# Patient Record
Sex: Male | Born: 1995 | Race: Black or African American | Hispanic: No | Marital: Single | Smoking: Never smoker
Health system: Southern US, Community
[De-identification: ages and names within clinical notes are randomized; demographics above are authoritative.]

---

## 2017-05-03 ENCOUNTER — Emergency Department (HOSPITAL_COMMUNITY)
Admission: EM | Admit: 2017-05-03 | Discharge: 2017-05-03 | Disposition: A | Discharge status: Home / Self Care | Payer: Self-pay | Attending: Emergency Medicine | Admitting: Emergency Medicine

## 2017-05-03 ENCOUNTER — Emergency Department (HOSPITAL_COMMUNITY): Payer: Self-pay

## 2017-05-03 ENCOUNTER — Encounter (HOSPITAL_COMMUNITY): Payer: Self-pay | Admitting: *Deleted

## 2017-05-03 DIAGNOSIS — Y92410 Unspecified street and highway as the place of occurrence of the external cause: Secondary | ICD-10-CM | POA: Insufficient documentation

## 2017-05-03 DIAGNOSIS — S299XXA Unspecified injury of thorax, initial encounter: Secondary | ICD-10-CM | POA: Insufficient documentation

## 2017-05-03 DIAGNOSIS — Y9389 Activity, other specified: Secondary | ICD-10-CM | POA: Insufficient documentation

## 2017-05-03 DIAGNOSIS — Y999 Unspecified external cause status: Secondary | ICD-10-CM | POA: Insufficient documentation

## 2017-05-03 MED ORDER — ACETAMINOPHEN 500 MG PO TABS: 500.0000 mg | ORAL_TABLET | Freq: Four times a day (QID) | ORAL | 0 refills | Status: AC | PRN

## 2017-05-03 MED ORDER — IBUPROFEN 800 MG PO TABS: 800.0000 mg | ORAL_TABLET | Freq: Three times a day (TID) | ORAL | 0 refills | Status: AC

## 2017-05-03 MED ORDER — METHOCARBAMOL 500 MG PO TABS: 500.0000 mg | ORAL_TABLET | Freq: Two times a day (BID) | ORAL | 0 refills | Status: AC
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
## 2017-05-03 NOTE — ED Notes (Signed)
Pt is in stable condition upon d/c and ambulates from ED. 
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
## 2021-05-31 ENCOUNTER — Encounter (HOSPITAL_COMMUNITY): Payer: Self-pay | Admitting: Emergency Medicine

## 2021-05-31 ENCOUNTER — Emergency Department (HOSPITAL_COMMUNITY)
Admission: EM | Admit: 2021-05-31 | Discharge: 2021-06-01 | Disposition: A | Discharge status: Home / Self Care | Payer: Self-pay | Attending: Emergency Medicine | Admitting: Emergency Medicine

## 2021-05-31 DIAGNOSIS — R42 Dizziness and giddiness: Secondary | ICD-10-CM | POA: Insufficient documentation

## 2021-05-31 DIAGNOSIS — R55 Syncope and collapse: Secondary | ICD-10-CM | POA: Insufficient documentation
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
## 2021-06-01 NOTE — Discharge Instructions (Addendum)
EKG, vital signs, and exam were reassuring. Highly recommend follow up with a primary care provider for further evaluation.  Return to the ED for chest pain, shortness of breath, passing out, persistent vomiting, or any other major concerns.

## 2023-03-21 ENCOUNTER — Ambulatory Visit
Admission: EM | Admit: 2023-03-21 | Discharge: 2023-03-21 | Disposition: A | Discharge status: Home / Self Care | Payer: Managed Care, Other (non HMO)

## 2023-03-21 DIAGNOSIS — J101 Influenza due to other identified influenza virus with other respiratory manifestations: Secondary | ICD-10-CM | POA: Diagnosis not present

## 2023-03-21 LAB — POCT INFLUENZA A/B
Influenza A, POC: POSITIVE — AB
Influenza B, POC: NEGATIVE

## 2023-03-21 MED ORDER — OSELTAMIVIR PHOSPHATE 75 MG PO CAPS: 75.0000 mg | ORAL_CAPSULE | Freq: Two times a day (BID) | ORAL | 0 refills | Status: AC

## 2023-03-21 NOTE — Discharge Instructions (Signed)
You have the flu which is being treated with Tamiflu.  Follow-up if any symptoms persist or worsen.

## 2023-03-21 NOTE — ED Triage Notes (Signed)
Here for "cold symptoms that started yesterday with a little fever". Fever "around 100 yesterday". Other symptoms: "runny nose, headache, chills". No sob.

## 2023-03-21 NOTE — ED Provider Notes (Signed)
[facility]    CSN: [id number] Arrival date & time: 03/21/23  0857      History   Chief Complaint Chief Complaint  Patient presents with   Fever    Entered by patient    HPI Chris Jones is a 27 y.o. male.   Patient presents with nasal congestion, runny nose, cough, fever, headache, chills that started yesterday.  Patient reports Tmax at home of 100.  Denies any known sick contacts.  Denies chest pain, shortness of breath, gastrointestinal symptoms.  Denies history of asthma.   Fever   History reviewed. No pertinent past medical history.  There are no problems to display for this patient.   History reviewed. No pertinent surgical history.     Home Medications    Prior to Admission medications   Medication Sig Start Date End Date Taking? Authorizing Provider  oseltamivir (TAMIFLU) 75 MG capsule Take 1 capsule (75 mg total) by mouth every 12 (twelve) hours. 03/21/23  Yes Lashawnta Burgert, Acie Fredrickson, FNP  UNABLE TO FIND Med Name: Cold medicine "last night". Store brand   Yes [provider]  acetaminophen (TYLENOL) 500 MG tablet Take 1 tablet (500 mg total) by mouth every 6 (six) hours as needed. 05/03/17   Law, Waylan Boga, PA-C  ibuprofen (ADVIL,MOTRIN) 800 MG tablet Take 1 tablet (800 mg total) by mouth 3 (three) times daily. 05/03/17   Law, Waylan Boga, PA-C  methocarbamol (ROBAXIN) 500 MG tablet Take 1 tablet (500 mg total) by mouth 2 (two) times daily. 05/03/17   Emi Holes, PA-C    Family History History reviewed. No pertinent family history.  Social History Social History   Tobacco Use   Smoking status: Never   Smokeless tobacco: Never  Vaping Use   Vaping Use: Never used  Substance Use Topics   Alcohol use: No   Drug use: No     Allergies   Patient has no known allergies.   Review of Systems Review of Systems Per HPI  Physical Exam Triage Vital Signs ED Triage Vitals  Enc Vitals Group     BP 03/21/23 0927 119/72     Pulse  Rate 03/21/23 0927 88     Resp 03/21/23 0927 18     Temp 03/21/23 0927 98.6 F (37 C)     Temp Source 03/21/23 0927 Oral     SpO2 03/21/23 0927 98 %     Weight 03/21/23 0924 165 lb (74.8 kg)     Height 03/21/23 0924 5\' 8"  (1.727 m)     Head Circumference --      Peak Flow --      Pain Score 03/21/23 0924 0     Pain Loc --      Pain Edu? --      Excl. in GC? --    No data found.  Updated Vital Signs BP 119/72 (BP Location: Left Arm)   Pulse 88   Temp 98.6 F (37 C) (Oral)   Resp 18   Ht 5\' 8"  (1.727 m)   Wt 165 lb (74.8 kg)   SpO2 98%   BMI 25.09 kg/m   Visual Acuity Right Eye Distance:   Left Eye Distance:   Bilateral Distance:    Right Eye Near:   Left Eye Near:    Bilateral Near:     Physical Exam Constitutional:      General: He is not in acute distress.    Appearance: Normal appearance. He is not toxic-appearing  or diaphoretic.  HENT:     Head: Normocephalic and atraumatic.     Right Ear: Tympanic membrane and ear canal normal.     Left Ear: Tympanic membrane and ear canal normal.     Nose: Congestion present.     Mouth/Throat:     Mouth: Mucous membranes are moist.     Pharynx: No posterior oropharyngeal erythema.  Eyes:     Extraocular Movements: Extraocular movements intact.     Conjunctiva/sclera: Conjunctivae normal.     Pupils: Pupils are equal, round, and reactive to light.  Cardiovascular:     Rate and Rhythm: Normal rate and regular rhythm.     Pulses: Normal pulses.     Heart sounds: Normal heart sounds.  Pulmonary:     Effort: Pulmonary effort is normal. No respiratory distress.     Breath sounds: Normal breath sounds. No stridor. No wheezing, rhonchi or rales.  Abdominal:     General: Abdomen is flat. Bowel sounds are normal.     Palpations: Abdomen is soft.  Musculoskeletal:        General: Normal range of motion.     Cervical back: Normal range of motion.  Skin:    General: Skin is warm and dry.  Neurological:     General: No  focal deficit present.     Mental Status: He is alert and oriented to person, place, and time. Mental status is at baseline.  Psychiatric:        Mood and Affect: Mood normal.        Behavior: Behavior normal.        Thought Content: Thought content normal.        Judgment: Judgment normal.      UC Treatments / Results  Labs (all labs ordered are listed, but only abnormal results are displayed) Labs Reviewed  POCT INFLUENZA A/B - Abnormal; Notable for the following components:      Result Value   Influenza A, POC Positive (*)    All other components within normal limits    EKG   Radiology No results found.  Procedures Procedures (including critical care time)  Medications Ordered in UC Medications - No data to display  Initial Impression / Assessment and Plan / UC Course  I have reviewed the triage vital signs and the nursing notes.  Pertinent labs & imaging results that were available during my care of the patient were reviewed by me and considered in my medical decision making (see chart for details).     Patient tested positive for influenza A.  Given patient is right inside the treatment window, will treat with Tamiflu.  Advised supportive care, symptom management, adequate fluids, rest, fever monitoring and management.  Discussed return precautions.  Patient verbalized understanding and was agreeable with plan. Final Clinical Impressions(s) / UC Diagnoses   Final diagnoses:  Influenza A     Discharge Instructions      You have the flu which is being treated with Tamiflu.  Follow-up if any symptoms persist or worsen.    ED Prescriptions     Medication Sig Dispense Auth. Provider   oseltamivir (TAMIFLU) 75 MG capsule Take 1 capsule (75 mg total) by mouth every 12 (twelve) hours. 10 capsule Gustavus Bryant, [place]      PDMP not reviewed this encounter.   Gustavus Bryant, [place] 03/21/23 [phone]
